# Patient Record
Sex: Male | Born: 1970 | Race: White | Hispanic: No | Marital: Married | Smoking: Never smoker
Health system: Southern US, Community
[De-identification: ages and names within clinical notes are randomized; demographics above are authoritative.]

## PROBLEM LIST (undated history)

## (undated) DIAGNOSIS — K219 Gastro-esophageal reflux disease without esophagitis: Secondary | ICD-10-CM

## (undated) DIAGNOSIS — E7800 Pure hypercholesterolemia, unspecified: Secondary | ICD-10-CM

## (undated) HISTORY — PX: CHOLECYSTECTOMY: SHX55

---

## 2008-06-26 ENCOUNTER — Emergency Department (HOSPITAL_BASED_OUTPATIENT_CLINIC_OR_DEPARTMENT_OTHER): Admission: EM | Admit: 2008-06-26 | Discharge: 2008-06-26 | Payer: Self-pay | Admitting: Emergency Medicine

## 2008-06-26 ENCOUNTER — Ambulatory Visit: Payer: Self-pay | Admitting: Diagnostic Radiology

## 2009-07-06 ENCOUNTER — Emergency Department (HOSPITAL_BASED_OUTPATIENT_CLINIC_OR_DEPARTMENT_OTHER): Admission: EM | Admit: 2009-07-06 | Discharge: 2009-07-06 | Payer: Self-pay | Admitting: Emergency Medicine

## 2010-04-10 LAB — DIFFERENTIAL
Basophils Absolute: 0.1 10*3/uL (ref 0.0–0.1)
Basophils Relative: 1 % (ref 0–1)
Monocytes Relative: 8 % (ref 3–12)
Neutro Abs: 9.4 10*3/uL — ABNORMAL HIGH (ref 1.7–7.7)
Neutrophils Relative %: 79 % — ABNORMAL HIGH (ref 43–77)

## 2010-04-10 LAB — CBC
MCHC: 34.2 g/dL (ref 30.0–36.0)
RBC: 5.09 MIL/uL (ref 4.22–5.81)

## 2010-04-10 LAB — BASIC METABOLIC PANEL
CO2: 26 mEq/L (ref 19–32)
Calcium: 9.4 mg/dL (ref 8.4–10.5)
Creatinine, Ser: 1.2 mg/dL (ref 0.4–1.5)
GFR calc Af Amer: >60 mL/min (ref >60)

## 2010-04-10 LAB — POCT CARDIAC MARKERS
CKMB, poc: 2.9 ng/mL (ref 1.0–8.0)
Myoglobin, poc: 169 ng/mL (ref 12–200)
Myoglobin, poc: 440 ng/mL (ref 12–200)

## 2010-09-26 IMAGING — CR DG CHEST 1V PORT
1 series · 1 of 1 positions shown · non-contrast
Comparison: None

CLINICAL DATA: Chest pain

PORTABLE CHEST - 1 VIEW

[view not recorded]
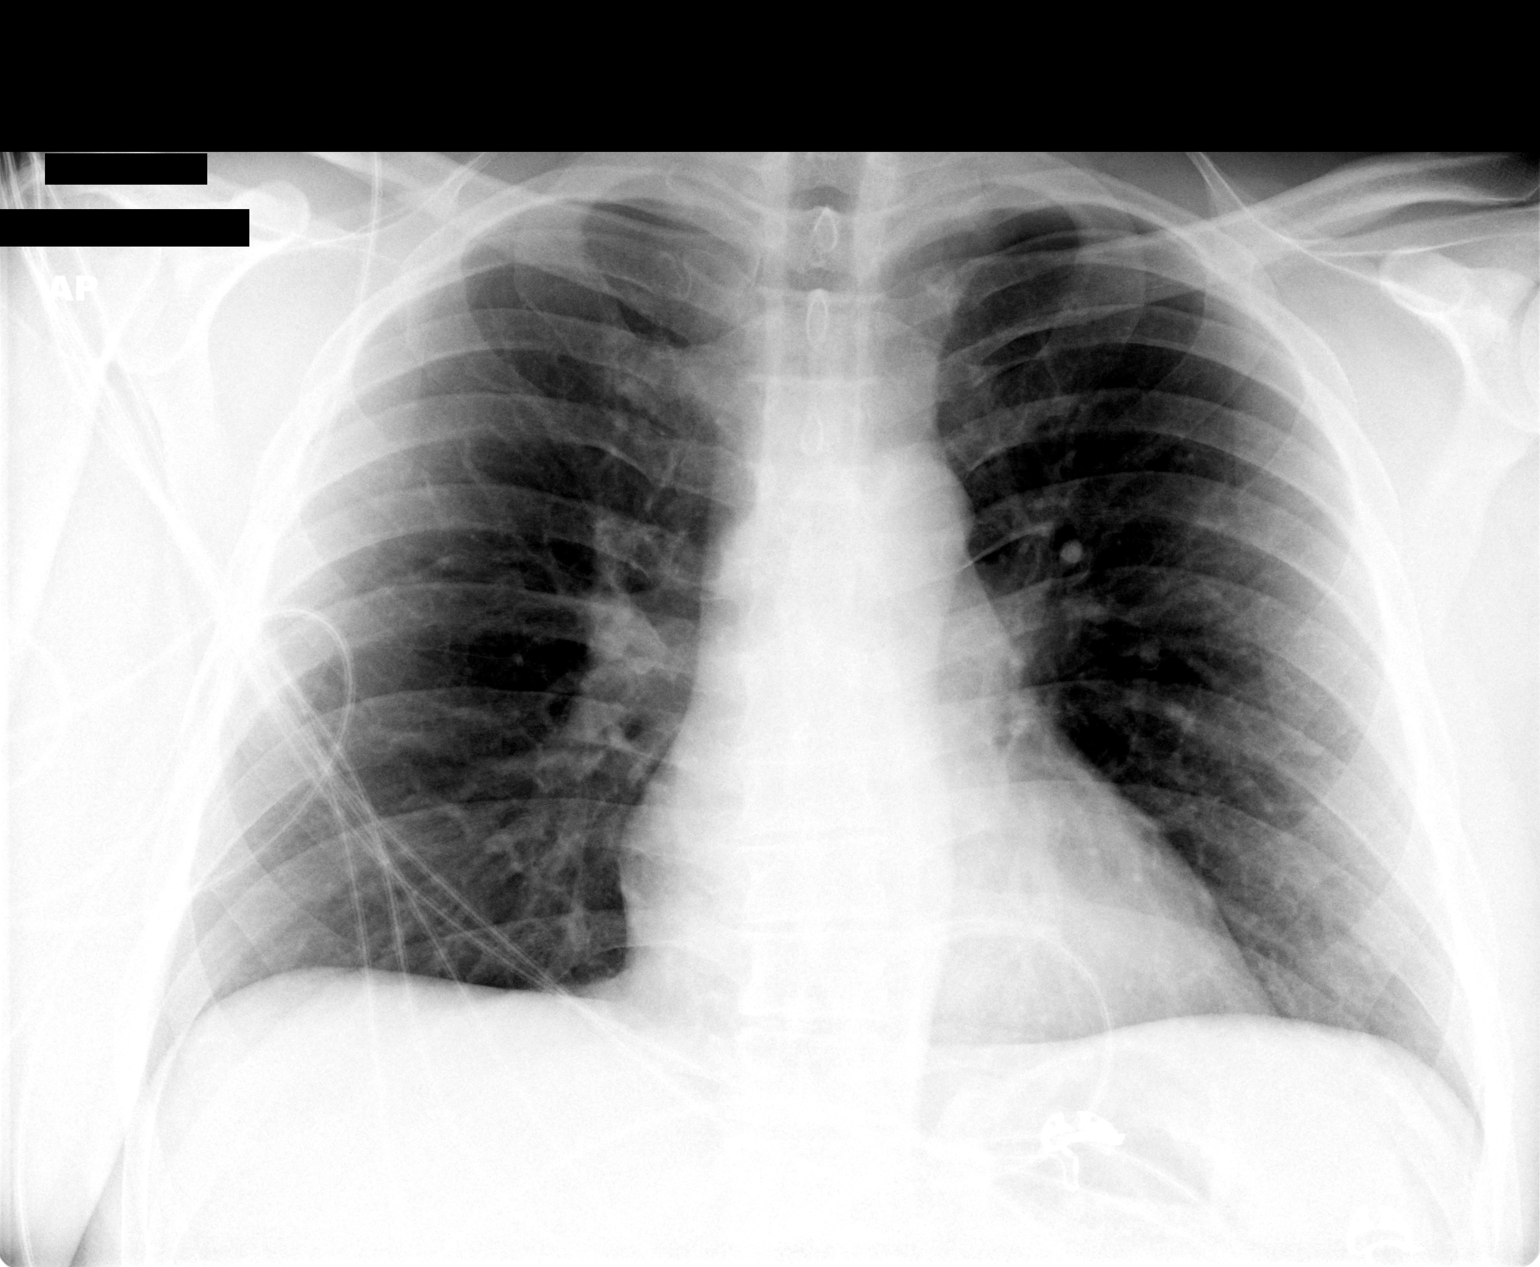

[1 of 1 positions shown; findings below may reference images not displayed]

FINDINGS: Heart and mediastinal contours are within normal limits.
No focal opacities or effusions.  No acute bony abnormality.
IMPRESSION: No active disease.

## 2018-11-30 ENCOUNTER — Encounter (HOSPITAL_BASED_OUTPATIENT_CLINIC_OR_DEPARTMENT_OTHER): Payer: Self-pay | Admitting: Emergency Medicine

## 2018-11-30 ENCOUNTER — Emergency Department (HOSPITAL_BASED_OUTPATIENT_CLINIC_OR_DEPARTMENT_OTHER)
Admission: EM | Admit: 2018-11-30 | Discharge: 2018-11-30 | Disposition: A | Discharge status: Home / Self Care | Payer: BC Managed Care – PPO | Attending: Emergency Medicine | Admitting: Emergency Medicine

## 2018-11-30 ENCOUNTER — Other Ambulatory Visit: Payer: Self-pay

## 2018-11-30 DIAGNOSIS — E782 Mixed hyperlipidemia: Secondary | ICD-10-CM | POA: Diagnosis not present

## 2018-11-30 DIAGNOSIS — Z20822 Contact with and (suspected) exposure to covid-19: Secondary | ICD-10-CM

## 2018-11-30 DIAGNOSIS — Z20828 Contact with and (suspected) exposure to other viral communicable diseases: Secondary | ICD-10-CM | POA: Diagnosis not present

## 2018-11-30 DIAGNOSIS — R519 Headache, unspecified: Secondary | ICD-10-CM

## 2018-11-30 HISTORY — DX: Pure hypercholesterolemia, unspecified: E78.00

## 2018-11-30 HISTORY — DX: Gastro-esophageal reflux disease without esophagitis: K21.9

## 2018-11-30 NOTE — ED Provider Notes (Signed)
[facility] [facility] EMERGENCY DEPARTMENT Provider Note   CSN: [id number] Arrival date & time: 11/30/18  1202     History   Chief Complaint Chief Complaint  Patient presents with  . Headache    wants COVID test    HPI David Preston is a 48 y.o. male.  He is here for Covid testing.  He says he had a close contact at work who was diagnosed with Covid.  He has had a headache since last evening.  Otherwise no cough shortness of breath fevers chills nausea vomiting diarrhea.     The history is provided by the patient.  Headache Pain location:  Generalized Radiates to:  Does not radiate Pain severity now: moderate. Onset quality:  Gradual Duration:  1 day Chronicity:  New Worsened by:  Nothing Ineffective treatments:  None tried Associated symptoms: no abdominal pain, no cough, no diarrhea, no fever, no nausea, no neck pain, no sore throat, no URI and no vomiting     Past Medical History:  Diagnosis Date  . GERD (gastroesophageal reflux disease)   . Hypercholesterolemia     There are no active problems to display for this patient.   Past Surgical History:  Procedure Laterality Date  . CHOLECYSTECTOMY          Home Medications    Prior to Admission medications   Not on File    Family History No family history on file.  Social History Social History   Tobacco Use  . Smoking status: Never Smoker  . Smokeless tobacco: Never Used  Substance Use Topics  . Alcohol use: Never    Frequency: Never  . Drug use: Never     Allergies   Patient has no known allergies.   Review of Systems Review of Systems  Constitutional: Negative for fever.  HENT: Negative for sore throat.   Eyes: Negative for visual disturbance.  Respiratory: Negative for cough and shortness of breath.   Cardiovascular: Negative for chest pain.  Gastrointestinal: Negative for abdominal pain, diarrhea, nausea and vomiting.  Genitourinary: Negative for dysuria.  Musculoskeletal:  Negative for neck pain.  Skin: Negative for rash.  Neurological: Positive for headaches.     Physical Exam Updated Vital Signs BP (!) 142/89 (BP Location: Left Arm)   Pulse 67   Temp 98.5 F (36.9 C) (Oral)   Resp 18   Ht 6' (1.829 m)   Wt (!) 138.3 kg   SpO2 98%   BMI 41.37 kg/m   Physical Exam Vitals signs and nursing note reviewed.  Constitutional:      Appearance: He is well-developed.  HENT:     Head: Normocephalic and atraumatic.  Eyes:     Conjunctiva/sclera: Conjunctivae normal.  Neck:     Musculoskeletal: Neck supple.  Cardiovascular:     Rate and Rhythm: Normal rate and regular rhythm.  Pulmonary:     Effort: Pulmonary effort is normal.     Breath sounds: Normal breath sounds.  Skin:    General: Skin is warm and dry.  Neurological:     Mental Status: He is alert and oriented to person, place, and time.     GCS: GCS eye subscore is 4. GCS verbal subscore is 5. GCS motor subscore is 6.     Gait: Gait normal.      ED Treatments / Results  Labs (all labs ordered are listed, but only abnormal results are displayed) Labs Reviewed  NOVEL CORONAVIRUS, NAA (HOSP ORDER, SEND-OUT TO REF LAB; TAT 18-24  HRS)    EKG None  Radiology No results found.  Procedures Procedures (including critical care time)  Medications Ordered in ED Medications - No data to display   Initial Impression / Assessment and Plan / ED Course  I have reviewed the triage vital signs and the nursing notes.  Pertinent labs & imaging results that were available during my care of the patient were reviewed by me and considered in my medical decision making (see chart for details).    David Preston was evaluated in Emergency Department on 11/30/2018 for the symptoms described in the history of present illness. He was evaluated in the context of the global COVID-19 pandemic, which necessitated consideration that the patient might be at risk for infection with the SARS-CoV-2 virus that  causes COVID-19. Institutional protocols and algorithms that pertain to the evaluation of patients at risk for COVID-19 are in a state of rapid change based on information released by regulatory bodies including the CDC and federal and state organizations. These policies and algorithms were followed during the patient's care in the ED.      Final Clinical Impressions(s) / ED Diagnoses   Final diagnoses:  Acute nonintractable headache, unspecified headache type  Person under investigation for COVID-19    ED Discharge Orders    None       Terrilee Files, MD 11/30/18 1714

## 2018-11-30 NOTE — Discharge Instructions (Signed)
You were evaluated in the emergency department for headache and close contact with a person positive for Covid.  Your Covid test is pending at the time of discharge and should result within the next day or 2.  You should isolate until your testing is back.  If your test is positive you will need to isolate for 14 days.  Please follow-up with your doctor or return to the emergency department if any worsening or concerning symptoms.

## 2018-11-30 NOTE — ED Triage Notes (Signed)
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 The patient states that he is unable to go back to work until he gets tested

## 2018-12-01 LAB — NOVEL CORONAVIRUS, NAA (HOSP ORDER, SEND-OUT TO REF LAB; TAT 18-24 HRS): SARS-CoV-2, NAA: NOT DETECTED
